# Patient Record
Sex: Male | Born: 1990 | Race: White | Hispanic: No | Marital: Single | Smoking: Current every day smoker
Health system: Southern US, Community
[De-identification: ages and names within clinical notes are randomized; demographics above are authoritative.]

## PROBLEM LIST (undated history)

## (undated) DIAGNOSIS — S0181XA Laceration without foreign body of other part of head, initial encounter: Secondary | ICD-10-CM

## (undated) HISTORY — DX: Laceration without foreign body of other part of head, initial encounter: S01.81XA

---

## 2013-02-21 ENCOUNTER — Encounter: Payer: Self-pay | Admitting: Family Medicine

## 2013-02-21 ENCOUNTER — Ambulatory Visit (INDEPENDENT_AMBULATORY_CARE_PROVIDER_SITE_OTHER): Payer: BC Managed Care – PPO | Admitting: Family Medicine

## 2013-02-21 VITALS — BP 123/67 | HR 75 | Ht 74.6 in | Wt 173.0 lb

## 2013-02-21 DIAGNOSIS — B86 Scabies: Secondary | ICD-10-CM

## 2013-02-21 DIAGNOSIS — R21 Rash and other nonspecific skin eruption: Secondary | ICD-10-CM

## 2013-02-21 NOTE — Patient Instructions (Addendum)
Call if 2 weeks if rash is not cleared.

## 2013-02-21 NOTE — Progress Notes (Signed)
Subjective:    Patient ID: Glen Martin, male    DOB: Mar 09, 1991, 21 y.o.   MRN: [id number]  HPI  Here to estab care:   Rash and itching started about 8 months ago.  Says was tx in November and it got better, ut not completely.   Says was seen 4 days ago and treated for scabies. He says he is feeling better. Says happened about 8 months ago.  Using a cream and pills.  Says overall it feels better. No hx of eczema but occ dry patch.  Lives with his mother ans she has not had any sxs. Told to wash clothes and sheets in hot water.    Review of Systems  Constitutional: Negative for fever, diaphoresis and unexpected weight change.  HENT: Negative for hearing loss, rhinorrhea, sneezing and tinnitus.   Eyes: Negative for visual disturbance.  Respiratory: Negative for cough and wheezing.   Cardiovascular: Negative for chest pain and palpitations.  Gastrointestinal: Negative for nausea, vomiting, diarrhea and blood in stool.  Genitourinary: Negative for dysuria and discharge.  Musculoskeletal: Negative for myalgias and arthralgias.  Skin: Positive for rash.  Neurological: Negative for headaches.  Hematological: Negative for adenopathy.  Psychiatric/Behavioral: Negative for sleep disturbance and dysphoric mood. The patient is not nervous/anxious.        BP 123/67  Pulse 75  Ht 6' 2.6" (1.895 m)  Wt 173 lb (78.472 kg)  BMI 21.85 kg/m2    No Known Allergies  Past Medical History  Diagnosis Date  . Chin laceration     Age 48.     No past surgical history on file.  History   Social History  . Marital Status: Single    Spouse Name: N/A    Number of Children: N/A  . Years of Education: N/A   Occupational History  . Not on file.   Social History Main Topics  . Smoking status: Former Smoker    Quit date: 08/29/2012  . Smokeless tobacco: Not on file  . Alcohol Use: No  . Drug Use: Yes    Special: Marijuana  . Sexually Active: Not Currently   Other Topics Concern  . Not on  file   Social History Narrative  . No narrative on file    No family history on file.  No outpatient encounter prescriptions on file as of 02/21/2013.   No facility-administered encounter medications on file as of 02/21/2013.       Objective:   Physical Exam  Constitutional: He is oriented to person, place, and time. He appears well-developed and well-nourished.  HENT:  Head: Normocephalic and atraumatic.  Cardiovascular: Normal rate, regular rhythm and normal heart sounds.   Pulmonary/Chest: Effort normal and breath sounds normal.  Neurological: He is alert and oriented to person, place, and time.  Skin: Skin is warm and dry. Rash noted.  Erythematous papules on arm, abdomen, legs. None on face or neck.   Psychiatric: He has a normal mood and affect. His behavior is normal.          Assessment & Plan:  Scabies - Call in 2 weeks if you are not better. We discussed  we can retreat if needed. Also discussed how to make sure to clean linens etc. He does have a redness bedroom encouraged him to come large plastic bags and fill it out for 5-7 days and this will get rid of any scabies as well. He also plans on getting a new mattress. Since he keeps getting this.none of  his friends or family  Have similar sxs.    He would like a complete physical as well as STD testing. Encouraged him to schedule an appointment sometime in the next couple weeks and we'll be happy to do this.

## 2013-02-28 ENCOUNTER — Encounter: Payer: Self-pay | Admitting: Family Medicine

## 2013-02-28 ENCOUNTER — Ambulatory Visit (INDEPENDENT_AMBULATORY_CARE_PROVIDER_SITE_OTHER): Payer: BC Managed Care – PPO | Admitting: Family Medicine

## 2013-02-28 VITALS — BP 118/76 | HR 68

## 2013-02-28 DIAGNOSIS — Z23 Encounter for immunization: Secondary | ICD-10-CM

## 2013-02-28 DIAGNOSIS — Z113 Encounter for screening for infections with a predominantly sexual mode of transmission: Secondary | ICD-10-CM

## 2013-02-28 DIAGNOSIS — Z0000 Encounter for general adult medical examination without abnormal findings: Secondary | ICD-10-CM

## 2013-02-28 LAB — COMPLETE METABOLIC PANEL WITH GFR
ALT: 20 U/L (ref 0–53)
AST: 21 U/L (ref 0–37)
Albumin: 4.3 g/dL (ref 3.5–5.2)
Calcium: 9.5 mg/dL (ref 8.4–10.5)
Chloride: 102 mEq/L (ref 96–112)
Potassium: 3.9 mEq/L (ref 3.5–5.3)
Total Protein: 7 g/dL (ref 6.0–8.3)

## 2013-02-28 LAB — LIPID PANEL
LDL Cholesterol: 73 mg/dL (ref 0–99)
VLDL: 24 mg/dL (ref 0–40)

## 2013-02-28 NOTE — Progress Notes (Signed)
  Subjective:    Patient ID: Glen Martin, male    DOB: [birth year], 22 y.o.   MRN: [id number]  HPI Here for CPE. No complaints. Not sure when his last tetanus shot was. He would like to be tested for STDs.    Review of Systems Comprehensive ROS is negative.    There were no vitals taken for this visit.    No Known Allergies  Past Medical History  Diagnosis Date  . Chin laceration     Age 22.     No past surgical history on file.  History   Social History  . Marital Status: Single    Spouse Name: N/A    Number of Children: N/A  . Years of Education: N/A   Occupational History  . Not on file.   Social History Main Topics  . Smoking status: Former Smoker    Quit date: 08/29/2012  . Smokeless tobacco: Not on file  . Alcohol Use: No  . Drug Use: Yes    Special: Marijuana  . Sexually Active: Not Currently   Other Topics Concern  . Not on file   Social History Narrative  . No narrative on file    No family history on file.  No outpatient encounter prescriptions on file as of 02/28/2013.   No facility-administered encounter medications on file as of 02/28/2013.          Objective:   Physical Exam  Constitutional: He is oriented to person, place, and time. He appears well-developed and well-nourished.  HENT:  Head: Normocephalic and atraumatic.  Right Ear: External ear normal.  Left Ear: External ear normal.  Nose: Nose normal.  Mouth/Throat: Oropharynx is clear and moist.  Eyes: Conjunctivae and EOM are normal. Pupils are equal, round, and reactive to light.  Neck: Normal range of motion. Neck supple. No thyromegaly present.  Cardiovascular: Normal rate, regular rhythm, normal heart sounds and intact distal pulses.   Pulmonary/Chest: Effort normal and breath sounds normal.  Abdominal: Soft. Bowel sounds are normal. He exhibits no distension and no mass. There is no tenderness. There is no rebound and no guarding.  Musculoskeletal: Normal range of motion.   Lymphadenopathy:    He has no cervical adenopathy.  Neurological: He is alert and oriented to person, place, and time. He has normal reflexes.  Skin: Skin is warm and dry.  Psychiatric: He has a normal mood and affect. His behavior is normal. Judgment and thought content normal.          Assessment & Plan:  CPE-  Keep up a regular exercise program and make sure you are eating a healthy diet Try to eat 4 servings of dairy a day, or if you are lactose intolerant take a calcium with vitamin D daily.  Your vaccines are up to date.  Requests STD testing. Tdap given today.   Recommend smoking cessation.

## 2013-03-01 LAB — HIV ANTIBODY (ROUTINE TESTING W REFLEX): HIV: NONREACTIVE

## 2013-03-01 LAB — HEPATITIS PANEL, ACUTE
Hep B C IgM: NEGATIVE
Hepatitis B Surface Ag: NEGATIVE

## 2013-03-01 LAB — HSV(HERPES SIMPLEX VRS) I + II AB-IGG: HSV 2 Glycoprotein G Ab, IgG: 0.14 IV

## 2013-03-02 LAB — GC/CHLAMYDIA PROBE AMP
CT Probe RNA: POSITIVE — AB
GC Probe RNA: NEGATIVE

## 2013-03-14 ENCOUNTER — Other Ambulatory Visit: Payer: BC Managed Care – PPO | Admitting: Family Medicine

## 2015-03-09 ENCOUNTER — Emergency Department (HOSPITAL_BASED_OUTPATIENT_CLINIC_OR_DEPARTMENT_OTHER)
Admission: EM | Admit: 2015-03-09 | Discharge: 2015-03-09 | Disposition: A | Discharge status: Home / Self Care | Payer: BLUE CROSS/BLUE SHIELD | Attending: Emergency Medicine | Admitting: Emergency Medicine

## 2015-03-09 ENCOUNTER — Encounter (HOSPITAL_BASED_OUTPATIENT_CLINIC_OR_DEPARTMENT_OTHER): Payer: Self-pay

## 2015-03-09 DIAGNOSIS — Z79899 Other long term (current) drug therapy: Secondary | ICD-10-CM | POA: Insufficient documentation

## 2015-03-09 DIAGNOSIS — R55 Syncope and collapse: Secondary | ICD-10-CM | POA: Diagnosis not present

## 2015-03-09 DIAGNOSIS — Z87828 Personal history of other (healed) physical injury and trauma: Secondary | ICD-10-CM | POA: Insufficient documentation

## 2015-03-09 DIAGNOSIS — Z87891 Personal history of nicotine dependence: Secondary | ICD-10-CM | POA: Diagnosis not present

## 2015-03-09 LAB — CBC WITH DIFFERENTIAL/PLATELET
BASOS ABS: 0 10*3/uL (ref 0.0–0.1)
Basophils Relative: 0 % (ref 0–1)
EOS ABS: 0.2 10*3/uL (ref 0.0–0.7)
EOS PCT: 2 % (ref 0–5)
HCT: 41.3 % (ref 39.0–52.0)
Hemoglobin: 14.2 g/dL (ref 13.0–17.0)
LYMPHS ABS: 1.3 10*3/uL (ref 0.7–4.0)
LYMPHS PCT: 14 % (ref 12–46)
MCH: 30.7 pg (ref 26.0–34.0)
MCHC: 34.4 g/dL (ref 30.0–36.0)
MCV: 89.2 fL (ref 78.0–100.0)
Monocytes Absolute: 0.5 10*3/uL (ref 0.1–1.0)
Monocytes Relative: 5 % (ref 3–12)
NEUTROS ABS: 7.2 10*3/uL (ref 1.7–7.7)
Neutrophils Relative %: 79 % — ABNORMAL HIGH (ref 43–77)
PLATELETS: 138 10*3/uL — AB (ref 150–400)
RBC: 4.63 MIL/uL (ref 4.22–5.81)
RDW: 12 % (ref 11.5–15.5)
WBC: 9.1 10*3/uL (ref 4.0–10.5)

## 2015-03-09 LAB — BASIC METABOLIC PANEL
Anion gap: 10 (ref 5–15)
BUN: 13 mg/dL (ref 6–20)
CALCIUM: 9.7 mg/dL (ref 8.9–10.3)
CO2: 26 mmol/L (ref 22–32)
Chloride: 103 mmol/L (ref 101–111)
Creatinine, Ser: 0.74 mg/dL (ref 0.61–1.24)
GFR calc Af Amer: >60 mL/min (ref >60)
GFR calc non Af Amer: >60 mL/min (ref >60)
GLUCOSE: 103 mg/dL — AB (ref 65–99)
POTASSIUM: 3.7 mmol/L (ref 3.5–5.1)
Sodium: 139 mmol/L (ref 135–145)

## 2015-03-09 LAB — CBG MONITORING, ED: GLUCOSE-CAPILLARY: 94 mg/dL (ref 65–99)

## 2015-03-09 LAB — TROPONIN I: Troponin I: <0.03 ng/mL (ref <0.031)

## 2015-03-09 NOTE — Discharge Instructions (Signed)
Vasovagal Syncope, Adult °Syncope, commonly known as fainting, is a temporary loss of consciousness. It occurs when the blood flow to the brain is reduced. Vasovagal syncope (also called neurocardiogenic syncope) is a fainting spell in which the blood flow to the brain is reduced because of a sudden drop in heart rate and blood pressure. Vasovagal syncope occurs when the brain and the cardiovascular system (blood vessels) do not adequately communicate and respond to each other. This is the most common cause of fainting. It often occurs in response to fear or some other type of emotional or physical stress. The body has a reaction in which the heart starts beating too slowly or the blood vessels expand, reducing blood pressure. This type of fainting spell is generally considered harmless. However, injuries can occur if a person takes a sudden fall during a fainting spell.  °CAUSES  °Vasovagal syncope occurs when a person's blood pressure and heart rate decrease suddenly, usually in response to a trigger. Many things and situations can trigger an episode. Some of these include:  °· Pain.   °· Fear.   °· The sight of blood or medical procedures, such as blood being drawn from a vein.   °· Common activities, such as coughing, swallowing, stretching, or going to the bathroom.   °· Emotional stress.   °· Prolonged standing, especially in a warm environment.   °· Lack of sleep or rest.   °· Prolonged lack of food.   °· Prolonged lack of fluids.   °· Recent illness. °· The use of certain drugs that affect blood pressure, such as cocaine, alcohol, marijuana, inhalants, and opiates.   °SYMPTOMS  °Before the fainting episode, you may:  °· Feel dizzy or light headed.   °· Become pale. °· Sense that you are going to faint.   °· Feel like the room is spinning.   °· Have tunnel vision, only seeing directly in front of you.   °· Feel sick to your stomach (nauseous).   °· See spots or slowly lose vision.   °· Hear ringing in your  ears.   °· Have a headache.   °· Feel warm and sweaty.   °· Feel a sensation of pins and needles. °During the fainting spell, you will generally be unconscious for no longer than a couple minutes before waking up and returning to normal. If you get up too quickly before your body can recover, you may faint again. Some twitching or jerky movements may occur during the fainting spell.  °DIAGNOSIS  °Your caregiver will ask about your symptoms, take a medical history, and perform a physical exam. Various tests may be done to rule out other causes of fainting. These may include blood tests and tests to check the heart, such as electrocardiography, echocardiography, and possibly an electrophysiology study. When other causes have been ruled out, a test may be done to check the body's response to changes in position (tilt table test). °TREATMENT  °Most cases of vasovagal syncope do not require treatment. Your caregiver may recommend ways to avoid fainting triggers and may provide home strategies for preventing fainting. If you must be exposed to a possible trigger, you can drink additional fluids to help reduce your chances of having an episode of vasovagal syncope. If you have warning signs of an oncoming episode, you can respond by positioning yourself favorably (lying down). °If your fainting spells continue, you may be given medicines to prevent fainting. Some medicines may help make you more resistant to repeated episodes of vasovagal syncope. Special exercises or compression stockings may be recommended. In rare cases, the surgical placement   of a pacemaker is considered. °HOME CARE INSTRUCTIONS  °· Learn to identify the warning signs of vasovagal syncope.   °· Sit or lie down at the first warning sign of a fainting spell. If sitting, put your head down between your legs. If you lie down, swing your legs up in the air to increase blood flow to the brain.   °· Avoid hot tubs and saunas. °· Avoid prolonged  standing. °· Drink enough fluids to keep your urine clear or pale yellow. Avoid caffeine. °· Increase salt in your diet as directed by your caregiver.   °· If you have to stand for a long time, perform movements such as:   °¨ Crossing your legs.   °¨ Flexing and stretching your leg muscles.   °¨ Squatting.   °¨ Moving your legs.   °¨ Bending over.   °· Only take over-the-counter or prescription medicines as directed by your caregiver. Do not suddenly stop any medicines without asking your caregiver first.  °SEEK MEDICAL CARE IF:  °· Your fainting spells continue or happen more frequently in spite of treatment.   °· You lose consciousness for more than a couple minutes. °· You have fainting spells during or after exercising or after being startled.   °· You have new symptoms that occur with the fainting spells, such as:   °¨ Shortness of breath. °¨ Chest pain.   °¨ Irregular heartbeat.   °· You have episodes of twitching or jerky movements that last longer than a few seconds. °· You have episodes of twitching or jerky movements without obvious fainting. °SEEK IMMEDIATE MEDICAL CARE IF:  °· You have injuries or bleeding after a fainting spell.   °· You have episodes of twitching or jerky movements that last longer than 5 minutes.   °· You have more than one spell of twitching or jerky movements before returning to consciousness after fainting. °MAKE SURE YOU:  °· Understand these instructions. °· Will watch your condition. °· Will get help right away if you are not doing well or get worse. °Document Released: 09/12/2012 Document Reviewed: 09/12/2012 °[employer]® Patient Information ©2015 [employer]. This information is not intended to replace advice given to you by your health care provider. Make sure you discuss any questions you have with your health care provider. ° °

## 2015-03-09 NOTE — ED Provider Notes (Signed)
CSN: [id number]     Arrival date & time 03/09/15  1348 History   First MD Initiated Contact with Patient 03/09/15 1416     Chief Complaint  Patient presents with  . Loss of Consciousness     (Consider location/radiation/quality/duration/timing/severity/associated sxs/prior Treatment) HPI The patient has a history of syncopal episodes with emotional stress. This has happened approximately 4 times over the past several years. In each circumstance there is a confrontational situation that is stressful. Today this happened at work. The patient usually gets preceding symptoms of feeling very hot and flushed. He tried to get in a crouching position so that he wouldn't pass out and hit his head. He thought that the symptoms had passed enough for him to stand back up, however as he stood back up that the last thing he remembered and he had a loss of consciousness. He came back around and EMS was called. EMS reported low blood sugar on the scene however we don't have a documented number. The patient reports that he ate something prior to being transported to the emergency department. He does not experience any chest pain or shortness of breath with these episodes. Patient does not have any history of syncope with exertion. He does not express chest pain or dyspnea with physical activity. Past Medical History  Diagnosis Date  . Chin laceration     Age 24.    History reviewed. No pertinent past surgical history. No family history on file. History  Substance Use Topics  . Smoking status: Former Smoker    Quit date: 08/29/2012  . Smokeless tobacco: Not on file  . Alcohol Use: No    Review of Systems  10 Systems reviewed and are negative for acute change except as noted in the HPI.   Allergies  Review of patient's allergies indicates no known allergies.  Home Medications   Prior to Admission medications   Medication Sig Start Date End Date Taking? Authorizing Provider  Pediatric  Multivit-Minerals-C (RA GUMMY VITAMINS & MINERALS PO) Take 1 tablet by mouth daily.    Historical Provider, MD   BP 105/70 mmHg  Pulse 60  Temp(Src) 98.4 F (36.9 C) (Oral)  Resp 18  Wt 160 lb (72.576 kg)  SpO2 100% Physical Exam  Constitutional: He is oriented to person, place, and time. He appears well-developed and well-nourished.  HENT:  Mouth/Throat: Oropharynx is clear and moist.  2 cm linear hematoma to the forehead.  Eyes: EOM are normal. Pupils are equal, round, and reactive to light.  Neck: Neck supple. No thyromegaly present.  No C-spine tenderness.  Cardiovascular: Normal rate, regular rhythm, normal heart sounds and intact distal pulses.   Pulmonary/Chest: Effort normal and breath sounds normal.  Abdominal: Soft. Bowel sounds are normal. He exhibits no distension. There is no tenderness.  Musculoskeletal: Normal range of motion. He exhibits no edema or tenderness.  Neurological: He is alert and oriented to person, place, and time. He has normal strength. Coordination normal. GCS eye subscore is 4. GCS verbal subscore is 5. GCS motor subscore is 6.  Skin: Skin is warm, dry and intact.  Psychiatric: He has a normal mood and affect.    ED Course  Procedures (including critical care time) Labs Review Labs Reviewed  CBC WITH DIFFERENTIAL/PLATELET - Abnormal; Notable for the following:    Platelets 138 (*)    Neutrophils Relative % 79 (*)    All other components within normal limits  BASIC METABOLIC PANEL - Abnormal; Notable for the following:  Glucose, Bld 103 (*)    All other components within normal limits  TROPONIN I  CBG MONITORING, ED    Imaging Review No results found.   EKG Interpretation   Date/Time:  Monday Mar 09 2015 15:39:42 EDT Ventricular Rate:  62 PR Interval:  128 QRS Duration: 108 QT Interval:  394 QTC Calculation: 399 R Axis:   89 Text Interpretation:  Normal sinus rhythm with sinus arrhythmia Incomplete  right bundle branch block  Borderline ECG Confirmed by Donnald GarrePfeiffer, MD, Lebron ConnersMarcy  [phone]([id number] on 03/09/2015 4:25:55 PM      MDM   Final diagnoses:  Vasodepressor syncope   History is consistent with a vasodepressive type of syncope. He does have a forehead hematoma but no headache and no evidence of neurologic injury. At this time the patient will be referred to cardiology for recurrent episodes of syncope, follow up instructions are provided.    Arby BarretteMarcy Tawnia Schirm, MD 03/09/15 [phone]

## 2015-03-09 NOTE — ED Notes (Addendum)
Patient was at work this am and had syncopal episode hitting concrete floor. Hematoma to right posterior scalp and left forehead. No nausea, no blurred vision, alert and oriented. EMS was called and he was told by same blood sugar low. Has eaten lunch pta, GCS-15

## 2018-01-26 ENCOUNTER — Emergency Department
Admission: EM | Admit: 2018-01-26 | Discharge: 2018-01-26 | Disposition: A | Discharge status: Home / Self Care | Payer: BLUE CROSS/BLUE SHIELD | Source: Home / Self Care | Attending: Family Medicine | Admitting: Family Medicine

## 2018-01-26 ENCOUNTER — Emergency Department (INDEPENDENT_AMBULATORY_CARE_PROVIDER_SITE_OTHER): Payer: BLUE CROSS/BLUE SHIELD

## 2018-01-26 DIAGNOSIS — M779 Enthesopathy, unspecified: Secondary | ICD-10-CM | POA: Diagnosis not present

## 2018-01-26 DIAGNOSIS — M79671 Pain in right foot: Secondary | ICD-10-CM

## 2018-01-26 DIAGNOSIS — M778 Other enthesopathies, not elsewhere classified: Secondary | ICD-10-CM

## 2018-01-26 MED ORDER — PREDNISONE 20 MG PO TABS: ORAL_TABLET | ORAL | 0 refills | Status: AC

## 2018-01-26 NOTE — ED Triage Notes (Signed)
Glen MillardGarth reports a desk fell on his right foot x 2 weeks ago, 2 days ago pain worse after exercise. He has taken Advil for the pain. He rates the pain a 7/10.

## 2018-01-26 NOTE — ED Provider Notes (Signed)
[facility]    CSN: [id number] Arrival date & time: 01/26/18  1349     History   Chief Complaint Chief Complaint  Patient presents with  . Foot Pain    x 2 days    HPI Jomarion Mish is a 27 y.o. male.   Patient reports that a desk base dropped on the dorsum of his right foot two weeks ago.  His foot improved until two days ago when he developed increased pain after exercising on an elliptical machine.  He has pain with weight bearing and walking.  The history is provided by the patient.  Foot Pain  This is a new problem. The current episode started more than 1 week ago. The problem occurs constantly. The problem has been gradually worsening. The symptoms are aggravated by walking. Nothing relieves the symptoms. Treatments tried: Warm soaks and ibuprofen. The treatment provided mild relief.    Past Medical History:  Diagnosis Date  . Chin laceration    Age 40.     There are no active problems to display for this patient.   History reviewed. No pertinent surgical history.     Home Medications    Prior to Admission medications   Medication Sig Start Date End Date Taking? Authorizing Provider  ibuprofen (ADVIL,MOTRIN) 200 MG tablet Take 200 mg by mouth every 6 (six) hours as needed.   Yes [provider]  Pediatric Multivit-Minerals-C (RA GUMMY VITAMINS & MINERALS PO) Take 1 tablet by mouth daily.    [provider]  predniSONE (DELTASONE) 20 MG tablet Take one tab by mouth twice daily for 4 days, then one daily. Take with food. 01/26/18   Lattie Haw, MD    Family History History reviewed. No pertinent family history.  Social History Social History   Tobacco Use  . Smoking status: Current Every Day Smoker    Packs/day: 1.00    Types: Cigarettes    Last attempt to quit: 08/29/2012    Years since quitting: 5.4  . Smokeless tobacco: Never Used  Substance Use Topics  . Alcohol use: No  . Drug use: Yes    Types: Marijuana      Allergies   Patient has no known allergies.   Review of Systems Review of Systems  All other systems reviewed and are negative.    Physical Exam Triage Vital Signs ED Triage Vitals  Enc Vitals Group     BP 01/26/18 1442 130/82     Pulse Rate 01/26/18 1442 71     Resp --      Temp 01/26/18 1442 97.6 F (36.4 C)     Temp Source 01/26/18 1442 Oral     SpO2 01/26/18 1442 98 %     Weight 01/26/18 1445 222 lb (100.7 kg)     Height 01/26/18 1445 6\' 2"  (1.88 m)     Head Circumference --      Peak Flow --      Pain Score 01/26/18 1443 7     Pain Loc --      Pain Edu? --      Excl. in GC? --    No data found.  Updated Vital Signs BP 130/82 (BP Location: Right Arm)   Pulse 71   Temp 97.6 F (36.4 C) (Oral)   Ht 6\' 2"  (1.88 m)   Wt 222 lb (100.7 kg)   SpO2 98%   BMI 28.50 kg/m   Visual Acuity Right Eye Distance:   Left Eye  Distance:   Bilateral Distance:    Right Eye Near:   Left Eye Near:    Bilateral Near:     Physical Exam  Constitutional: He appears well-developed and well-nourished. No distress.  HENT:  Head: Normocephalic.  Eyes: Pupils are equal, round, and reactive to light.  Neck: Normal range of motion.  Cardiovascular: Normal rate.  Pulmonary/Chest: Effort normal.  Musculoskeletal:       Right foot: There is decreased range of motion, tenderness and bony tenderness. There is no swelling, normal capillary refill and no crepitus.       Feet:  Patient has distinct tenderness to palpation over the extensor tendons of the right 2nd, 3rd, and 4th toes.  Neurological: He is alert.  Skin: Skin is warm and dry.  Nursing note and vitals reviewed.    UC Treatments / Results  Labs (all labs ordered are listed, but only abnormal results are displayed) Labs Reviewed - No data to display  EKG None Radiology Dg Foot Complete Right  Result Date: 01/26/2018 CLINICAL DATA:  Anterior foot pain for 2 days after using the elliptical at he gym EXAM:  RIGHT FOOT COMPLETE - 3+ VIEW COMPARISON:  None. FINDINGS: There is no evidence of fracture or dislocation. There is no evidence of arthropathy or other focal bone abnormality. Soft tissues are unremarkable. IMPRESSION: Negative. Electronically Signed   By: Norva PavlovElizabeth  Brown M.D.   On: 01/26/2018 15:22    Procedures Procedures (including critical care time)  Medications Ordered in UC Medications - No data to display   Initial Impression / Assessment and Plan / UC Course  I have reviewed the triage vital signs and the nursing notes.  Pertinent labs & imaging results that were available during my care of the patient were reviewed by me and considered in my medical decision making (see chart for details).    Begin prednisone burst/taper. Apply ice pack for 10 to 15 minutes, 3 to 4 times daily  Continue until pain and swelling decrease.  Begin calf stretching exercises. Followup with Dr. Rodney Langtonhomas Thekkekandam or Dr. Clementeen GrahamEvan Corey ([facility]) if not improving about two weeks.    Final Clinical Impressions(s) / UC Diagnoses   Final diagnoses:  Extensor tendonitis of foot    ED Discharge Orders        Ordered    predniSONE (DELTASONE) 20 MG tablet     01/26/18 1555        Lattie HawBeese, Parveen Freehling A, MD 01/31/18 2126

## 2018-01-26 NOTE — Discharge Instructions (Addendum)
Apply ice pack for 10 to 15 minutes, 3 to 4 times daily  Continue until pain and swelling decrease.  Begin calf stretching exercises.

## 2019-04-13 IMAGING — DX DG FOOT COMPLETE 3+V*R*
3 series · 3 of 3 positions shown · non-contrast
Comparison: None.

CLINICAL DATA: Anterior foot pain for 2 days after using the
elliptical at he gym

EXAM:
RIGHT FOOT COMPLETE - 3+ VIEW

[foot ap]
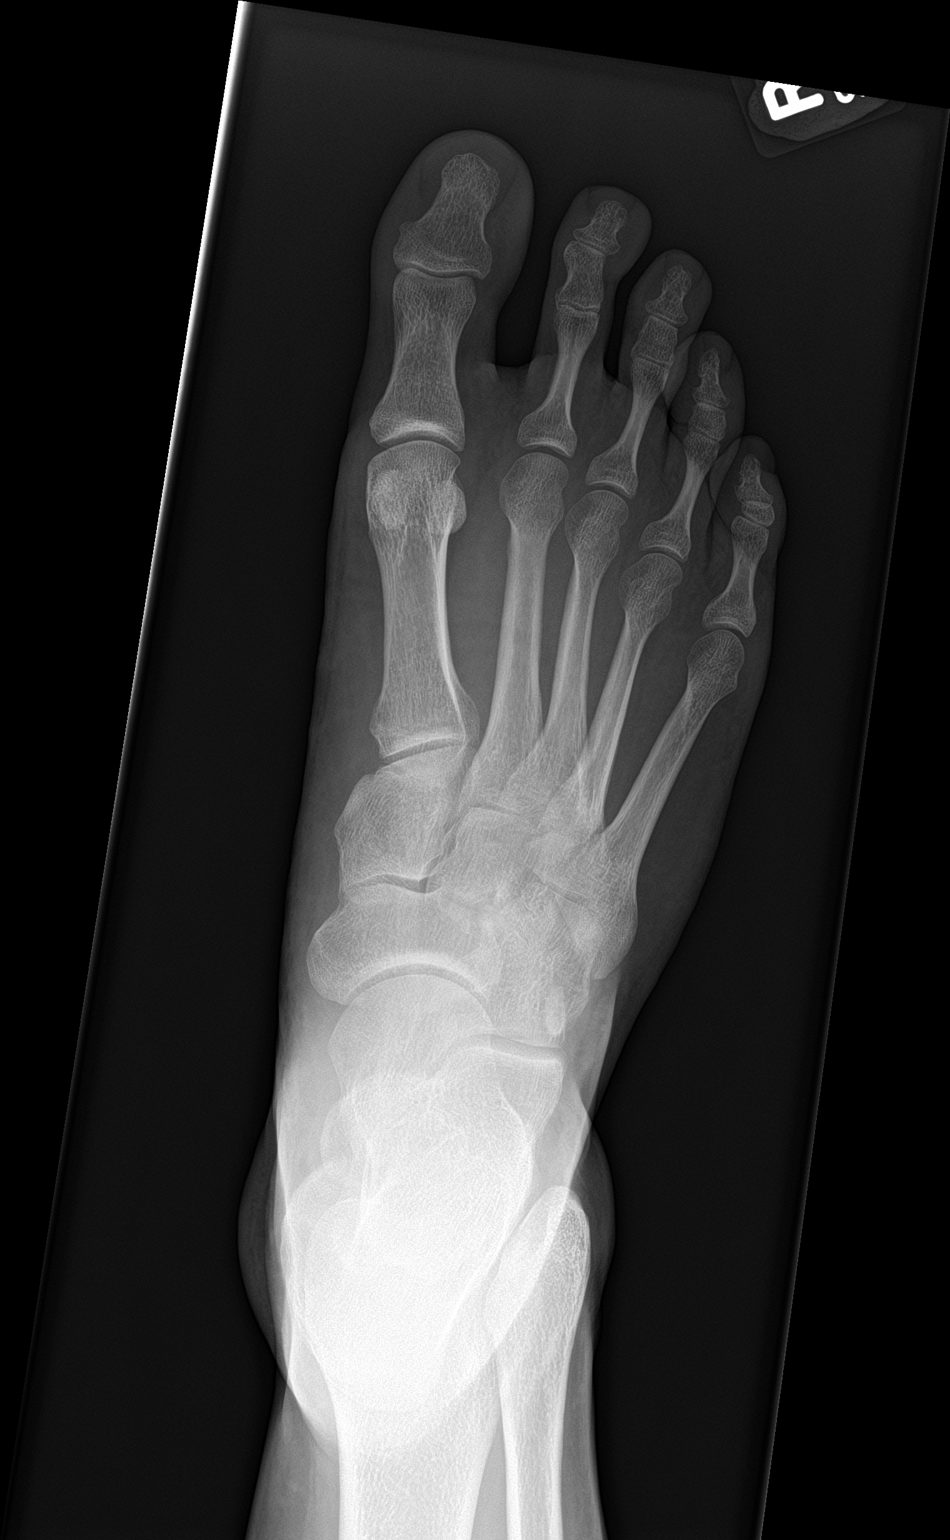

[foot obl]
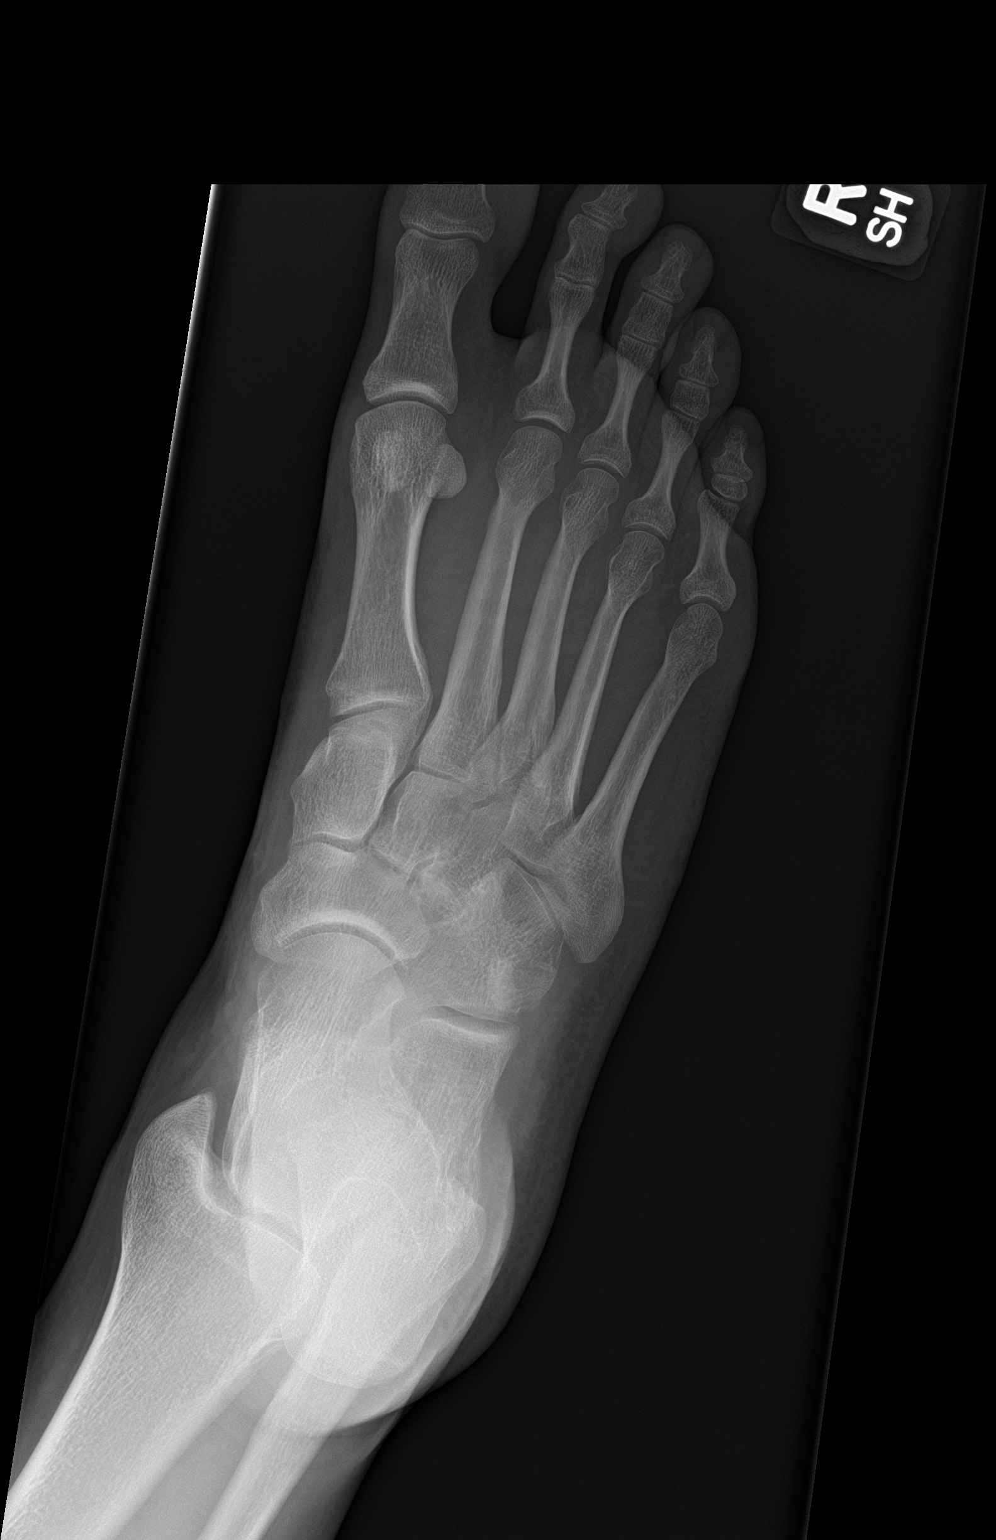

[foot lat]
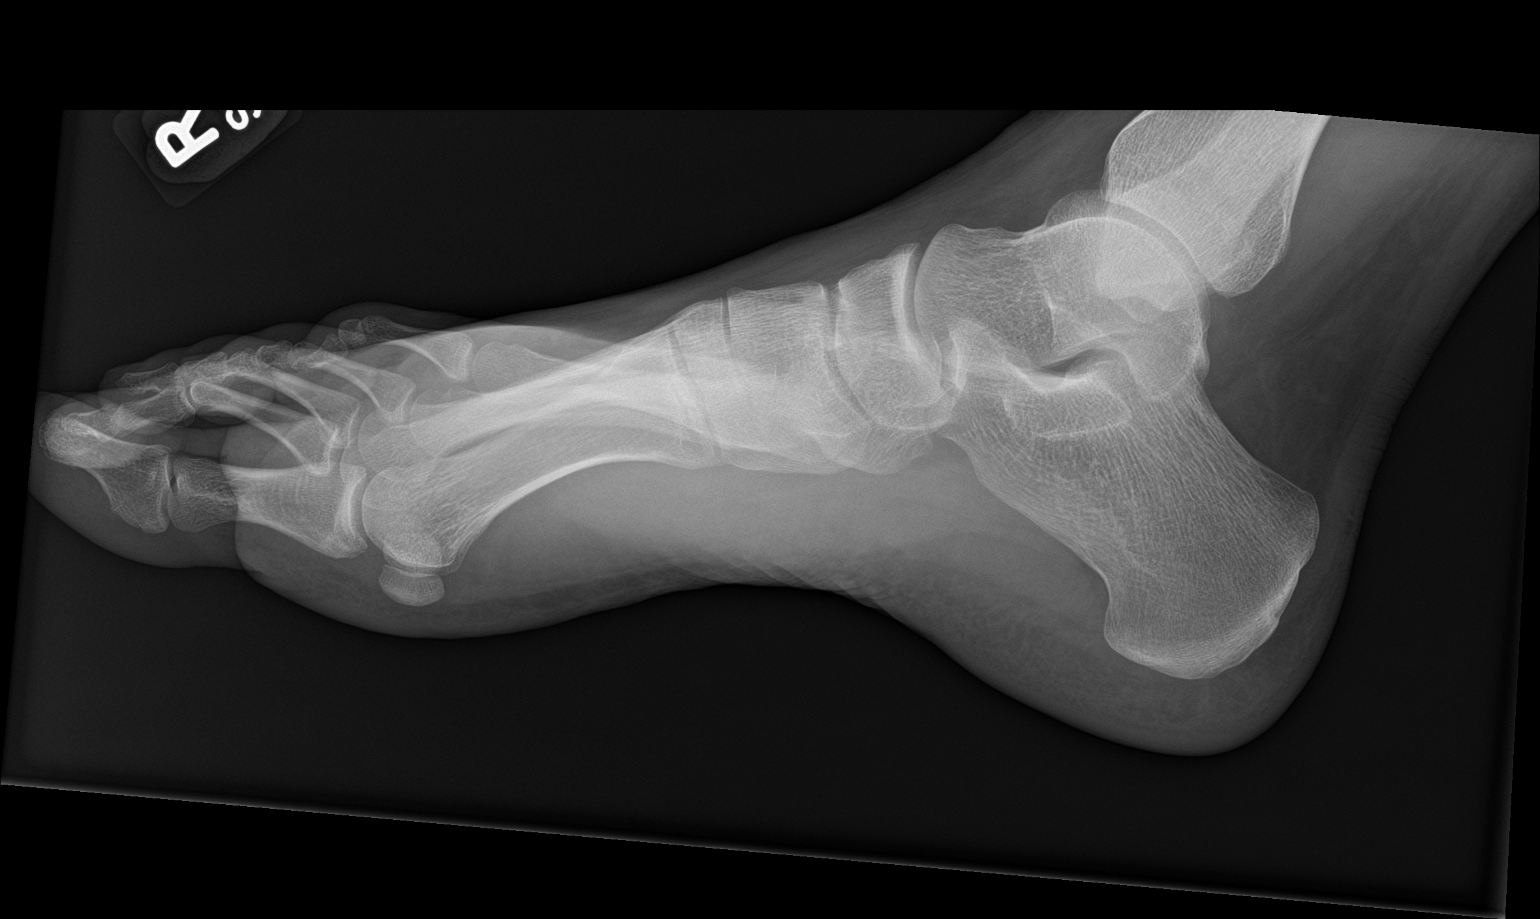

[3 of 3 positions shown; findings below may reference images not displayed]

FINDINGS: There is no evidence of fracture or dislocation. There is no
evidence of arthropathy or other focal bone abnormality. Soft
tissues are unremarkable.
IMPRESSION: Negative.
